# Patient Record
Sex: Male | Born: 1990 | Race: Black or African American | Hispanic: No | Marital: Single | Smoking: Current every day smoker
Health system: Southern US, Community
[De-identification: ages and names within clinical notes are randomized; demographics above are authoritative.]

## PROBLEM LIST (undated history)

## (undated) ENCOUNTER — Emergency Department (HOSPITAL_COMMUNITY): Admission: EM | Discharge status: Against Medical Advice | Payer: Self-pay

---
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
## 2008-02-13 ENCOUNTER — Emergency Department (HOSPITAL_COMMUNITY): Admission: EM | Admit: 2008-02-13 | Discharge: 2008-02-13 | Payer: Self-pay | Admitting: Emergency Medicine

## 2008-02-20 ENCOUNTER — Emergency Department (HOSPITAL_COMMUNITY): Admission: EM | Admit: 2008-02-20 | Discharge: 2008-02-20 | Payer: Self-pay | Admitting: Emergency Medicine

## 2008-02-26 ENCOUNTER — Emergency Department (HOSPITAL_COMMUNITY): Admission: EM | Admit: 2008-02-26 | Discharge: 2008-02-26 | Payer: Self-pay | Admitting: Emergency Medicine

## 2010-05-28 ENCOUNTER — Emergency Department (HOSPITAL_COMMUNITY): Admission: EM | Admit: 2010-05-28 | Discharge: 2010-05-28 | Payer: Self-pay | Admitting: Family Medicine

## 2010-09-25 LAB — GC/CHLAMYDIA PROBE AMP, GENITAL
Chlamydia, DNA Probe: NEGATIVE
GC Probe Amp, Genital: NEGATIVE

## 2011-12-20 ENCOUNTER — Emergency Department (HOSPITAL_COMMUNITY)
Admission: EM | Admit: 2011-12-20 | Discharge: 2011-12-20 | Disposition: A | Discharge status: Home / Self Care | Payer: Self-pay | Attending: Emergency Medicine | Admitting: Emergency Medicine

## 2011-12-20 ENCOUNTER — Encounter (HOSPITAL_COMMUNITY): Payer: Self-pay

## 2011-12-20 DIAGNOSIS — L02419 Cutaneous abscess of limb, unspecified: Secondary | ICD-10-CM | POA: Insufficient documentation

## 2011-12-20 DIAGNOSIS — L0390 Cellulitis, unspecified: Secondary | ICD-10-CM

## 2011-12-20 DIAGNOSIS — F17200 Nicotine dependence, unspecified, uncomplicated: Secondary | ICD-10-CM | POA: Insufficient documentation

## 2011-12-20 MED ORDER — NAPROXEN 500 MG PO TABS
500.0000 mg | ORAL_TABLET | Freq: Once | ORAL | Status: AC
  Administered 2011-12-20: 500 mg via ORAL
  Filled 2011-12-20 (×2): qty 1

## 2011-12-20 MED ORDER — NAPROXEN 500 MG PO TABS: 500.0000 mg | ORAL_TABLET | Freq: Two times a day (BID) | ORAL | Status: AC

## 2011-12-20 MED ORDER — DOXYCYCLINE HYCLATE 100 MG PO TABS: 100.0000 mg | ORAL_TABLET | Freq: Two times a day (BID) | ORAL | Status: AC

## 2011-12-20 MED ORDER — DOXYCYCLINE HYCLATE 100 MG PO TABS
100.0000 mg | ORAL_TABLET | Freq: Once | ORAL | Status: AC
  Administered 2011-12-20: 100 mg via ORAL
  Filled 2011-12-20: qty 1

## 2011-12-20 MED ORDER — HYDROCODONE-ACETAMINOPHEN 5-325 MG PO TABS: 1.0000 | ORAL_TABLET | Freq: Four times a day (QID) | ORAL | Status: AC | PRN

## 2011-12-20 NOTE — Discharge Instructions (Signed)
Cellulitis Cellulitis is an infection of the tissue under the skin. The infected area is usually red and tender. This is caused by germs. These germs enter the body through cuts or sores. This usually happens in the arms or lower legs. HOME CARE   Take your medicine as told. Finish it even if you start to feel better.   If the infection is on the arm or leg, keep it raised (elevated).   Use a warm cloth on the infected area several times a day.   See your doctor for a follow-up visit as told.  GET HELP RIGHT AWAY IF:   You are tired or confused.   You throw up (vomit).   You have watery poop (diarrhea).   You feel ill and have muscle aches.   You have a fever.  MAKE SURE YOU:   Understand these instructions.   Will watch your condition.   Will get help right away if you are not doing well or get worse.  Document Released: 12/18/2007 Document Revised: 06/20/2011 Document Reviewed: 06/02/2009 [employer] Patient Information 2012 [employer]. 

## 2011-12-20 NOTE — ED Provider Notes (Signed)
History   This chart was scribed for Gerald Kras, MD by Shari Heritage. The patient was seen in room STRE5/STRE5. Patient's care was started at 1156.     CSN: [id number]  Arrival date & time 12/20/11  1156   None    Chief complaint knee pain and swelling   The history is provided by the patient. No language interpreter was used.   Gerald Barber is a 21 y.o. male who presents to the Emergency Department complaining of swelling and drainage at a blister in right knee onset 3 days ago. Patient says that he popped the blister and began experiencing pain in the area earlier this week. Patient reports that the blister appeared initially about 5 weeks ago. Patient says that he feels the most pain and discomfort when he walks or extends his knee, but at rest he doesn't experience much discomfort. Patient denies any other pain, trauma or discomfort. Patient denies experiencing these symptoms before. Patient is a current everyday smoker.  History reviewed. No pertinent past medical history.  History reviewed. No pertinent past surgical history.  No family history on file.  History  Substance Use Topics  . Smoking status: Current Everyday Smoker  . Smokeless tobacco: Not on file  . Alcohol Use: No      Review of Systems A complete 10 system review of systems was obtained and all systems are negative except as noted in the HPI and PMH.   Allergies  Review of patient's allergies indicates no known allergies.  Home Medications  No current outpatient prescriptions on file.  BP 134/83  Pulse 65  Temp(Src) 98.2 F (36.8 C) (Oral)  Resp 16  SpO2 99%  Physical Exam  Nursing note and vitals reviewed. Constitutional: He appears well-developed and well-nourished. No distress.  HENT:  Head: Normocephalic and atraumatic.  Right Ear: External ear normal.  Left Ear: External ear normal.  Eyes: Conjunctivae are normal. Right eye exhibits no discharge. Left eye exhibits no discharge. No scleral  icterus.  Neck: Neck supple. No tracheal deviation present.  Cardiovascular: Normal rate.   Pulmonary/Chest: Effort normal. No stridor. No respiratory distress.  Musculoskeletal: Normal range of motion. He exhibits no edema.  Neurological: He is alert. Cranial nerve deficit: no gross deficits.  Skin: Skin is warm and dry. No rash noted. There is erythema (medial aspect of knee. no joint diffusion. small papule without fluctuance or induration).  Psychiatric: He has a normal mood and affect.    ED Course  Procedures (including critical care time) DIAGNOSTIC STUDIES: Oxygen Saturation is 99% on room air, normalby my interpretation.    COORDINATION OF CARE: 12:24PM - Patient informed of current plan for treatment and evaluation and agrees with plan at this time. Will prescribe pain medications and antibiotics. Suggested that patient use warm soaps and compresses to increase blood supply and reduce drainage in knee.   Labs Reviewed - No data to display No results found.   1. Cellulitis       MDM  Patient's exam is consistent with a cellulitis. There does not appear to be evidence of an abscess or signs of a septic arthritis. The patient will be prescribed antibiotics and I will have him followup in 2 days to be rechecked   I personally performed the services described in this documentation, which was scribed in my presence.  The recorded information has been reviewed and considered.   Gerald Kras, MD 12/20/11 [phone]

## 2011-12-20 NOTE — ED Notes (Signed)
Rt. Knee swollen, red and inflammed, appears to be infected

## 2014-03-21 ENCOUNTER — Encounter (HOSPITAL_COMMUNITY): Payer: Self-pay | Admitting: Emergency Medicine

## 2014-03-21 ENCOUNTER — Emergency Department (HOSPITAL_COMMUNITY): Payer: Self-pay

## 2014-03-21 ENCOUNTER — Emergency Department (HOSPITAL_COMMUNITY)
Admission: EM | Admit: 2014-03-21 | Discharge: 2014-03-21 | Disposition: A | Discharge status: Home / Self Care | Payer: Self-pay | Attending: Emergency Medicine | Admitting: Emergency Medicine

## 2014-03-21 DIAGNOSIS — Y990 Civilian activity done for income or pay: Secondary | ICD-10-CM | POA: Insufficient documentation

## 2014-03-21 DIAGNOSIS — F17200 Nicotine dependence, unspecified, uncomplicated: Secondary | ICD-10-CM | POA: Insufficient documentation

## 2014-03-21 DIAGNOSIS — Y9389 Activity, other specified: Secondary | ICD-10-CM | POA: Insufficient documentation

## 2014-03-21 DIAGNOSIS — W230XXA Caught, crushed, jammed, or pinched between moving objects, initial encounter: Secondary | ICD-10-CM | POA: Insufficient documentation

## 2014-03-21 DIAGNOSIS — S6000XA Contusion of unspecified finger without damage to nail, initial encounter: Secondary | ICD-10-CM | POA: Insufficient documentation

## 2014-03-21 DIAGNOSIS — S67196A Crushing injury of right little finger, initial encounter: Secondary | ICD-10-CM

## 2014-03-21 DIAGNOSIS — Y9289 Other specified places as the place of occurrence of the external cause: Secondary | ICD-10-CM | POA: Insufficient documentation

## 2014-03-21 DIAGNOSIS — S6980XA Other specified injuries of unspecified wrist, hand and finger(s), initial encounter: Secondary | ICD-10-CM | POA: Insufficient documentation

## 2014-03-21 DIAGNOSIS — S6990XA Unspecified injury of unspecified wrist, hand and finger(s), initial encounter: Secondary | ICD-10-CM | POA: Insufficient documentation

## 2014-03-21 DIAGNOSIS — T148XXA Other injury of unspecified body region, initial encounter: Secondary | ICD-10-CM

## 2014-03-21 MED ORDER — HYDROCODONE-ACETAMINOPHEN 5-325 MG PO TABS
1.0000 | ORAL_TABLET | Freq: Four times a day (QID) | ORAL | Status: DC | PRN
Start: 2014-03-21 — End: 2020-12-06

## 2014-03-21 MED ORDER — NAPROXEN 500 MG PO TABS: 500.0000 mg | ORAL_TABLET | Freq: Two times a day (BID) | ORAL | Status: DC | PRN

## 2014-03-21 NOTE — ED Notes (Signed)
Per pt injured right pinky finger at work a week ago. Pt finger swollen and fingernail darkened.

## 2014-03-21 NOTE — ED Provider Notes (Signed)
CSN: [id number]     Arrival date & time 03/21/14  1549 History   None    This chart was scribed for non-physician practitioner, Allen Derry PA-C working with No att. providers found by Arlan Organ, ED Scribe. This patient was seen in room TR04C/TR04C and the patient's care was started at 5:38 PM.   Chief Complaint  Patient presents with  . Finger Injury   Patient is a 23 y.o. male presenting with hand injury. The history is provided by the patient. No language interpreter was used.  Hand Injury Location:  Finger Time since incident:  2 days Injury: yes   Finger location:  R little finger Pain details:    Radiates to:  Does not radiate   Severity:  Moderate   Onset quality:  Sudden   Timing:  Constant   Progression:  Unchanged Chronicity:  New Dislocation: no   Foreign body present:  No foreign bodies Prior injury to area:  No Relieved by:  None tried Worsened by:  Nothing tried Ineffective treatments:  None tried Associated symptoms: swelling   Associated symptoms: no fever, no numbness and no tingling     HPI Comments: Gerald Barber is a 23 y.o. male who presents to the Emergency Department complaining of a finger injury sustained 2 days ago. Pt states he was at work when his finger was crushed between a wall and a cement brick. Now c/o of constant pain with associated swelling to the R pinky finger that is unchanged. Pt describes pain as throbbing and nonradiating. He has not tried any OTC medications or any home remedies to help manage symptoms. No fever or chills, no warmth to the area. No weakness, loss of sensation, or paresthesia. Does not bleed easily. No known allergies to medications. He has no pertinent past medical history. No other concerns this visit.    History reviewed. No pertinent past medical history. History reviewed. No pertinent past surgical history. History reviewed. No pertinent family history. History  Substance Use Topics  . Smoking status:  Current Every Day Smoker  . Smokeless tobacco: Not on file  . Alcohol Use: No    Review of Systems  Constitutional: Negative for fever and chills.  Musculoskeletal: Positive for arthralgias and joint swelling.  Skin: Positive for color change. Negative for wound.  Neurological: Negative for weakness and numbness.  Hematological: Does not bruise/bleed easily.  A complete 10 system review of systems was obtained and all systems are negative except as noted in the HPI and PMH.    Allergies  Review of patient's allergies indicates no known allergies.  Home Medications   Prior to Admission medications   Not on File   Triage Vitals: BP 138/74  Pulse 79  Temp(Src) 98.9 F (37.2 C)  Resp 18  Wt 139 lb 4 oz (63.163 kg)  SpO2 99%   Physical Exam  Nursing note and vitals reviewed. Constitutional: He is oriented to person, place, and time. Vital signs are normal. He appears well-developed and well-nourished. No distress.  HENT:  Head: Normocephalic and atraumatic.  Mouth/Throat: Mucous membranes are normal.  Eyes: Conjunctivae and EOM are normal.  Neck: Normal range of motion. Neck supple.  Cardiovascular: Normal rate and intact distal pulses.   Intact distal pulses, cap refill present in all digits  Pulmonary/Chest: Effort normal. No respiratory distress.  Abdominal: Normal appearance. He exhibits no distension.  Musculoskeletal: Normal range of motion.       Right hand: He exhibits tenderness and swelling.  He exhibits normal range of motion, no bony tenderness, normal two-point discrimination, normal capillary refill, no deformity and no laceration. Normal sensation noted. Normal strength noted.       Hands: R pinky with subungal hemtoma and associated swelling, mildly TTP, with FROM in DIP/PIP/MCP joints. Distal pulses intact. Sensation grossly intact, 2 point discrimination intact, no bony deformity or TTP. Strength 5/5 in all extremities  Neurological: He is alert and oriented  to person, place, and time. He has normal strength. No sensory deficit.  Skin: Skin is warm, dry and intact. Bruising noted.  subungal hematoma  Psychiatric: He has a normal mood and affect.    ED Course  NERVE BLOCK Date/Time: 03/21/2014 7:45 PM Performed by: CAMPRUBI-SOMS, Yoshiaki Kreuser STRUPP Authorized by: Ramond Marrow Consent: Verbal consent obtained. Risks and benefits: risks, benefits and alternatives were discussed Consent given by: patient Patient understanding: patient states understanding of the procedure being performed Patient consent: the patient's understanding of the procedure matches consent given Patient identity confirmed: verbally with patient Indications: pain relief Indications comments: and subungal hematoma drainage Body area: upper extremity Nerve: digital Laterality: left Patient sedated: no Preparation: Patient was prepped and draped in the usual sterile fashion. Patient position: sitting Needle gauge: 22 G Location technique: anatomical landmarks Local anesthetic: lidocaine 2% without epinephrine Anesthetic total: 4 ml Outcome: pain improved Patient tolerance: Patient tolerated the procedure well with no immediate complications.   (including critical care time)  DIAGNOSTIC STUDIES: Oxygen Saturation is 99% on RA, Normal by my interpretation.    COORDINATION OF CARE: 5:37 PM- Will order DG finger little R. Will relieve pressure underneath R pinky finger nail. Advised pt to keep area clean and to wash hands thoroughly throughout the day. Made pt aware of signs of infections and to return if any noted symptoms. Discussed treatment plan with pt at bedside and pt agreed to plan.     Labs Review Labs Reviewed - No data to display  Imaging Review Dg Finger Little Right  03/21/2014   CLINICAL DATA:  Crush injury to distal tip of right middle finger.  EXAM: RIGHT LITTLE FINGER 2+V  COMPARISON:  None.  FINDINGS: No acute osseous or joint  abnormality.  Mild soft tissue swelling.  IMPRESSION: No acute osseous or joint abnormality.   Electronically Signed   By: Leanna Battles M.D.   On: 03/21/2014 16:39     EKG Interpretation None      MDM   Final diagnoses:  Crushing injury of right little finger, initial encounter  Hematoma    23y/o male with a subungual hematoma of his right pinky finger. X-ray obtained and was negative. Digit neurovascularly intact. Patient opted for a drainage of the subungual hematoma which was performed with a cautery. Patient tolerated this procedure well and was relieved of pressure under the nail. Discussed the risk of infection, and discussed that in order to avoid infection he would need to use antibiotic ointment and keep his wound clean. Discussed ice and use of pain medication given today. Discussed followup in 7 days.   I personally performed the services described in this documentation, which was scribed in my presence. The recorded information has been reviewed and is accurate.   BP 109/71  Pulse 51  Temp(Src) 98.6 F (37 C) (Oral)  Resp 16  Wt 139 lb 4 oz (63.163 kg)  SpO2 100%  Meds ordered this encounter  Medications  . HYDROcodone-acetaminophen (NORCO) 5-325 MG per tablet    Sig: Take 1-2 tablets  by mouth every 6 (six) hours as needed for severe pain.    Dispense:  10 tablet    Refill:  0    Order Specific Question:  Supervising Provider    Answer:  Eber Hong D [[id number]]  . naproxen (NAPROSYN) 500 MG tablet    Sig: Take 1 tablet (500 mg total) by mouth 2 (two) times daily as needed for mild pain, moderate pain or headache (TAKE WITH MEALS.).    Dispense:  20 tablet    Refill:  0    Order Specific Question:  Supervising Provider    Answer:  Eber Hong D [[id number]]     [place] Camprubi-Soms, PA-C 03/21/14 2002

## 2014-03-21 NOTE — Discharge Instructions (Signed)
Use the finger splint for comfort. Keep wound and clean with mild soap and water. Keep area covered with a topical antibiotic ointment and bandage, keep bandage dry. Ice and elevate for additional pain relief and swelling. Alternate between naprosyn and norco for additional pain relief. Follow up with your primary care doctor or the [facility] in approximately 7 days for wound recheck. Monitor area for signs of infection to include, but not limited to: increasing pain, redness, drainage/pus, or swelling. Return to emergency department for emergent changing or worsening symptoms. Use the resource guide below to find a doctor if you don't have one.   Crush Injury, Fingers or Toes A crush injury to the fingers or toes means the tissues have been damaged by being squeezed (compressed). There will be bleeding into the tissues and swelling. Often, blood will collect under the skin. When this happens, the skin on the finger often dies and may slough off (shed) 1 week to 10 days later. Usually, new skin is growing underneath. If the injury has been too severe and the tissue does not survive, the damaged tissue may begin to turn black over several days.  Wounds which occur because of the crushing may be stitched (sutured) shut. However, crush injuries are more likely to become infected than other injuries.These wounds may not be closed as tightly as other types of cuts to prevent infection. Nails involved are often lost. These usually grow back over several weeks.  DIAGNOSIS X-rays may be taken to see if there is any injury to the bones. TREATMENT Broken bones (fractures) may be treated with splinting, depending on the fracture. Often, no treatment is required for fractures of the last bone in the fingers or toes. HOME CARE INSTRUCTIONS   The crushed part should be raised (elevated) above the heart or center of the chest as much as possible for the first several days or as directed. This helps  with pain and lessens swelling. Less swelling increases the chances that the crushed part will survive.  Put ice on the injured area.  Put ice in a plastic bag.  Place a towel between your skin and the bag.  Leave the ice on for 15-20 minutes, 03-04 times a day for the first 2 days.  Only take over-the-counter or prescription medicines for pain, discomfort, or fever as directed by your caregiver.  Use your injured part only as directed.  Change your bandages (dressings) as directed.  Keep all follow-up appointments as directed by your caregiver. Not keeping your appointment could result in a chronic or permanent injury, pain, and disability. If there is any problem keeping the appointment, you must call to reschedule. SEEK IMMEDIATE MEDICAL CARE IF:   There is redness, swelling, or increasing pain in the wound area.  Pus is coming from the wound.  You have a fever.  You notice a bad smell coming from the wound or dressing.  The edges of the wound do not stay together after the sutures have been removed.  You are unable to move the injured finger or toe. MAKE SURE YOU:   Understand these instructions.  Will watch your condition.  Will get help right away if you are not doing well or get worse. Document Released: 07/01/2005 Document Revised: 09/23/2011 Document Reviewed: 11/16/2010 [facility] Patient Information 2015 [place], [place]. This information is not intended to replace advice given to you by your health care provider. Make sure you discuss any questions you have with your health care  provider.  Cryotherapy Cryotherapy is when you put ice on your injury. Ice helps lessen pain and puffiness (swelling) after an injury. Ice works the best when you start using it in the first 24 to 48 hours after an injury. HOME CARE  Put a dry or damp towel between the ice pack and your skin.  You may press gently on the ice pack.  Leave the ice on for no more than 10 to 20 minutes at  a time.  Check your skin after 5 minutes to make sure your skin is okay.  Rest at least 20 minutes between ice pack uses.  Stop using ice when your skin loses feeling (numbness).  Do not use ice on someone who cannot tell you when it hurts. This includes small children and people with memory problems (dementia). GET HELP RIGHT AWAY IF:  You have white spots on your skin.  Your skin turns blue or pale.  Your skin feels waxy or hard.  Your puffiness gets worse. MAKE SURE YOU:   Understand these instructions.  Will watch your condition.  Will get help right away if you are not doing well or get worse. Document Released: 12/18/2007 Document Revised: 09/23/2011 Document Reviewed: 02/21/2011 [facility] Patient Information 2015 [place], [place]. This information is not intended to replace advice given to you by your health care provider. Make sure you discuss any questions you have with your health care provider.  Hematoma A hematoma is a collection of blood under the skin, in an organ, in a body space, in a joint space, or in other tissue. The blood can clot to form a lump that you can see and feel. The lump is often firm and may sometimes become sore and tender. Most hematomas get better in a few days to weeks. However, some hematomas may be serious and require medical care. Hematomas can range in size from very small to very large. CAUSES  A hematoma can be caused by a blunt or penetrating injury. It can also be caused by spontaneous leakage from a blood vessel under the skin. Spontaneous leakage from a blood vessel is more likely to occur in older people, especially those taking blood thinners. Sometimes, a hematoma can develop after certain medical procedures. SIGNS AND SYMPTOMS   A firm lump on the body.  Possible pain and tenderness in the area.  Bruising.Blue, dark blue, purple-red, or yellowish skin may appear at the site of the hematoma if the hematoma is close to the surface  of the skin. For hematomas in deeper tissues or body spaces, the signs and symptoms may be subtle. For example, an intra-abdominal hematoma may cause abdominal pain, weakness, fainting, and shortness of breath. An intracranial hematoma may cause a headache or symptoms such as weakness, trouble speaking, or a change in consciousness. DIAGNOSIS  A hematoma can usually be diagnosed based on your medical history and a physical exam. Imaging tests may be needed if your health care provider suspects a hematoma in deeper tissues or body spaces, such as the abdomen, head, or chest. These tests may include ultrasonography or a CT scan.  TREATMENT  Hematomas usually go away on their own over time. Rarely does the blood need to be drained out of the body. Large hematomas or those that may affect vital organs will sometimes need surgical drainage or monitoring. HOME CARE INSTRUCTIONS   Apply ice to the injured area:   Put ice in a plastic bag.   Place a towel between your skin  and the bag.   Leave the ice on for 20 minutes, 2-3 times a day for the first 1 to 2 days.   After the first 2 days, switch to using warm compresses on the hematoma.   Elevate the injured area to help decrease pain and swelling. Wrapping the area with an elastic bandage may also be helpful. Compression helps to reduce swelling and promotes shrinking of the hematoma. Make sure the bandage is not wrapped too tight.   If your hematoma is on a lower extremity and is painful, crutches may be helpful for a couple days.   Only take over-the-counter or prescription medicines as directed by your health care provider. SEEK IMMEDIATE MEDICAL CARE IF:   You have increasing pain, or your pain is not controlled with medicine.   You have a fever.   You have worsening swelling or discoloration.   Your skin over the hematoma breaks or starts bleeding.   Your hematoma is in your chest or abdomen and you have weakness, shortness  of breath, or a change in consciousness.  Your hematoma is on your scalp (caused by a fall or injury) and you have a worsening headache or a change in alertness or consciousness. MAKE SURE YOU:   Understand these instructions.  Will watch your condition.  Will get help right away if you are not doing well or get worse. Document Released: 02/13/2004 Document Revised: 03/03/2013 Document Reviewed: 12/09/2012 [facility] Patient Information 2015 [place], [place]. This information is not intended to replace advice given to you by your health care provider. Make sure you discuss any questions you have with your health care provider.  Emergency Department Resource Guide 1) Find a Doctor and Pay Out of Pocket Although you won't have to find out who is covered by your insurance plan, it is a good idea to ask around and get recommendations. You will then need to call the office and see if the doctor you have chosen will accept you as a new patient and what types of options they offer for patients who are self-pay. Some doctors offer discounts or will set up payment plans for their patients who do not have insurance, but you will need to ask so you aren't surprised when you get to your appointment.  2) Contact Your Local Health Department Not all health departments have doctors that can see patients for sick visits, but many do, so it is worth a call to see if yours does. If you don't know where your local health department is, you can check in your phone book. The CDC also has a tool to help you locate your state's health department, and many state websites also have listings of all of their local health departments.  3) Find a Walk-in Clinic If your illness is not likely to be very severe or complicated, you may want to try a walk in clinic. These are popping up all over the country in pharmacies, drugstores, and shopping centers. They're usually staffed by nurse practitioners or physician assistants that  have been trained to treat common illnesses and complaints. They're usually fairly quick and inexpensive. However, if you have serious medical issues or chronic medical problems, these are probably not your best option.  No Primary Care Doctor: - Call [facility] at  [phone] - they can help you locate a primary care doctor that  accepts your insurance, provides certain services, etc. - Physician Referral Service- [phone]  Chronic Pain Problems: Organization  Address  Phone   Notes  [facility]  [phone] Patients need to be referred by their primary care doctor.   Medication Assistance: Organization         Address  Phone   Notes  [facility] Medication [facility] [address] [place], [place] [address] [phone] --Must be a resident of [facility] -- Must have NO insurance coverage whatsoever (no Medicaid/ Medicare, etc.) -- The pt. MUST have a primary care doctor that directs their care regularly and follows them in the community   [facility]  [phone]   [employer]  [phone]    Agencies that provide inexpensive medical care: Organization         Address  Phone   Notes  [facility]  [phone]   [facility]    [phone]   [facility] [address] [place], [place] [address] [phone]   [facility] [address] [place] [phone]   [facility]    [phone]   [facility]    [phone]   [facility]  [address], [place] Phone:  [phone], Fax:  [phone] Hours of Operation:  9 am - 6 pm, M-F.  Also accepts Medicaid/Medicare and self-pay.  [facility]  [address], [place] Phone: [phone], Fax: [phone]. Hours of Operation:  8:30 am - 5:30 pm, M-F.  Also accepts Medicaid and  self-pay.  [facility] [place] [address], [place] Phone: [phone]   [facility] [address] Natasha Bence [place], [place] [phone], Ext. [phone] Mondays & Thursdays: 7-9 AM.  First 15 patients are seen on a first come, first serve basis.    Medicaid-accepting [facility] Providers:  Organization         Address  Phone   Notes  [facility] [address], [place] [phone] Also accepts self-pay patients.  [facility] [address] Laure[address] Josephs [place], [place]  [phone]   [facility] [address], [place] [phone]   [facility] [address]iketh [place], [place] [phone]   Renaye Rakers [address], [place]   [phone] Only accepts [place] patients after they have their name applied to their card.   Self-Pay (no insurance) in [facility]:  Organization         Address  Phone   Notes  Sickle Cell Patients, [facility] [address] [place], [place] [phone]   [facility] [address] [place], [place] [phone]   [facility] [place]  [address], [place] [phone]   [facility]/Dr. Osei-Bonsu  [address] [place] or [address], [place] [phone] Phone number for both [place] and [place] locations is the same.  [facility] [address] [place] [phone]   [facility] [address] [place] or [address] [phone]   [facility] [address], [place] [phone], phone; [phone], fax Sees patients 1st and 3rd Saturday of every month.  Must not qualify for  public or private insurance (i.e. Medicaid, Medicare, [place] Health Choice, Veterans' Benefits)  Household income  should be no more than 200% of the poverty level The clinic cannot treat you if you are pregnant or think you are pregnant  Sexually transmitted diseases are not treated at the clinic.

## 2014-03-22 NOTE — ED Provider Notes (Signed)
Medical screening examination/treatment/procedure(s) were performed by non-physician practitioner and as supervising physician I was immediately available for consultation/collaboration.   EKG Interpretation None        Aerie Donica J. Amyra Vantuyl, MD 03/22/14 0043 

## 2015-03-23 ENCOUNTER — Emergency Department (HOSPITAL_COMMUNITY)
Admission: EM | Admit: 2015-03-23 | Discharge: 2015-03-23 | Disposition: A | Discharge status: Home / Self Care | Payer: Self-pay | Attending: Emergency Medicine | Admitting: Emergency Medicine

## 2015-03-23 ENCOUNTER — Encounter (HOSPITAL_COMMUNITY): Payer: Self-pay | Admitting: Emergency Medicine

## 2015-03-23 DIAGNOSIS — L0231 Cutaneous abscess of buttock: Secondary | ICD-10-CM | POA: Insufficient documentation

## 2015-03-23 DIAGNOSIS — Z720 Tobacco use: Secondary | ICD-10-CM | POA: Insufficient documentation

## 2015-03-23 MED ORDER — LIDOCAINE HCL (PF) 1 % IJ SOLN
5.0000 mL | Freq: Once | INTRAMUSCULAR | Status: AC
  Administered 2015-03-23: 5 mL
  Filled 2015-03-23: qty 5

## 2015-03-23 MED ORDER — NAPROXEN 500 MG PO TABS: 500.0000 mg | ORAL_TABLET | Freq: Two times a day (BID) | ORAL | Status: DC

## 2015-03-23 MED ORDER — SULFAMETHOXAZOLE-TRIMETHOPRIM 800-160 MG PO TABS: 1.0000 | ORAL_TABLET | Freq: Two times a day (BID) | ORAL | Status: AC

## 2015-03-23 NOTE — Discharge Instructions (Signed)
Abscess °An abscess (boil or furuncle) is an infected area on or under the skin. This area is filled with yellowish-white fluid (pus) and other material (debris). °HOME CARE  °· Only take medicines as told by your doctor. °· If you were given antibiotic medicine, take it as directed. Finish the medicine even if you start to feel better. °· If gauze is used, follow your doctor's directions for changing the gauze. °· To avoid spreading the infection: °¨ Keep your abscess covered with a bandage. °¨ Wash your hands well. °¨ Do not share personal care items, towels, or whirlpools with others. °¨ Avoid skin contact with others. °· Keep your skin and clothes clean around the abscess. °· Keep all doctor visits as told. °GET HELP RIGHT AWAY IF:  °· You have more pain, puffiness (swelling), or redness in the wound site. °· You have more fluid or blood coming from the wound site. °· You have muscle aches, chills, or you feel sick. °· You have a fever. °MAKE SURE YOU:  °· Understand these instructions. °· Will watch your condition. °· Will get help right away if you are not doing well or get worse. °Document Released: 12/18/2007 Document Revised: 12/31/2011 Document Reviewed: 09/13/2011 °Exit[employer]® Patient Information ©2015 [employer]. This information is not intended to replace advice given to you by your health care provider. Make sure you discuss any questions you have with your health care provider. ° °

## 2015-03-23 NOTE — ED Provider Notes (Signed)
CSN: [id number]     Arrival date & time 03/23/15  1851 History  This chart was scribed for non-physician practitioner, Kerrie Buffalo, NP working with Tilden Fossa, MD, by Jarvis Morgan, ED Scribe. This patient was seen in room TR03C/TR03C and the patient's care was started at 7:22 PM.     Chief Complaint  Patient presents with  . Abscess    The patient said he noticed a bump on the "crack iof his butt".  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     Patient is a 24 y.o. male presenting with abscess. The history is provided by the patient. No language interpreter was used.  Abscess Location:  Ano-genital Ano-genital abscess location:  Gluteal cleft and L buttock Abscess quality: draining, painful and redness   Duration:  2 days Progression:  Unchanged Pain details:    Severity:  Moderate   Duration:  2 days   Timing:  Intermittent   Progression:  Unchanged Chronicity:  New Context: not diabetes and not immunosuppression   Relieved by:  Warm compresses and warm water soaks Exacerbated by: bowel movements. Ineffective treatments:  None tried Associated symptoms: no fever, no nausea and no vomiting   Risk factors: prior abscess     HPI Comments: Faizaan D Shall is a 24 y.o. male with no PMHx who presents to the Emergency Department complaining of moderately painful, firm lesion to the left buttock near intergluteal cleft onset 2 days ago. He endorses some mild drainage from the area and mild pain with bowel movements. Pt reports he applied heat to the area and took a warm bath with mild relief. Pt states he has a h/o abscesses but states he has never had one to the area before. He has not taken any medications PTA. He denies any fever, chills, constipation or diarrhea.   History reviewed. No pertinent past medical history. History reviewed. No pertinent past surgical history. History reviewed. No pertinent family history. Social History  Substance Use  Topics  . Smoking status: Current Every Day Smoker  . Smokeless tobacco: None  . Alcohol Use: No    Review of Systems  Constitutional: Negative for fever.  Gastrointestinal: Negative for nausea, vomiting, diarrhea and constipation.  Skin: Positive for color change (firm, skin lesion to left buttock).  All other systems reviewed and are negative.     Allergies  Review of patient's allergies indicates no known allergies.  Home Medications   Prior to Admission medications   Medication Sig Start Date End Date Taking? Authorizing Provider  HYDROcodone-acetaminophen (NORCO) 5-325 MG per tablet Take 1-2 tablets by mouth every 6 (six) hours as needed for severe pain. 03/21/14   Mercedes Camprubi-Soms, PA-C  naproxen (NAPROSYN) 500 MG tablet Take 1 tablet (500 mg total) by mouth 2 (two) times daily. 03/23/15   Easten Maceachern Orlene Och, NP  sulfamethoxazole-trimethoprim (BACTRIM DS,SEPTRA DS) 800-160 MG per tablet Take 1 tablet by mouth 2 (two) times daily. 03/23/15 03/30/15  Kandice Schmelter Orlene Och, NP   Triage Vitals: BP 125/86 mmHg  Pulse 88  Temp(Src) 98.9 F (37.2 C) (Oral)  Resp 16  SpO2 98%  Physical Exam  Constitutional: He is oriented to person, place, and time. He appears well-developed and well-nourished.  HENT:  Head: Normocephalic and atraumatic.  Eyes: EOM are normal. Pupils are equal, round, and reactive to light.  Neck: Neck supple.  Cardiovascular: Normal rate and regular rhythm.   Pulmonary/Chest: No respiratory distress. He has no  wheezes.  Abdominal: Soft. There is no tenderness.  Genitourinary:  Firm, 3cm tender raised fluctuant area to the left buttock that extends into gluteal fold  Musculoskeletal: Normal range of motion. He exhibits no edema.  Neurological: He is alert and oriented to person, place, and time. No cranial nerve deficit.  Skin: Skin is warm and dry.  Psychiatric: He has a normal mood and affect. His behavior is normal.  Nursing note and vitals reviewed.   ED Course   Procedures (including critical care time)  DIAGNOSTIC STUDIES: Oxygen Saturation is 98% on RA, normal by my interpretation.    COORDINATION OF CARE:  7:59 PM  INCISION AND DRAINAGE PROCEDURE NOTE: Patient identification was confirmed and verbal consent was obtained. This procedure was performed by Kerrie Buffalo, NP at 7:59 PM. Site: left buttock and extends into gluteal fold Sterile procedures observed Needle size: 27 guage Anesthetic used (type and amt): 4 ccs lidocaine 1% w/o epinephrine Blade size: #11 Drainage: moderate amt of purulent drainage Complexity: Complex Packing used: none Site anesthetized, incision made over site, wound drained and explored loculations, rinsed with copious amounts of normal saline, wound not packed , covered with dry, sterile dressing.  Pt tolerated procedure well without complications.  Instructions for care discussed verbally and pt provided with additional written instructions for homecare and f/u.  Chaperoned by Asencion Gowda.   MDM  24 y.o. male with abscess to the left buttock, stable for d/c with antibiotics and pain medication. He will follow up with the general surgeon as needed. Discussed with the patient plan of care and all questioned fully answered. He will return if any problems arise.  Final diagnoses:  Abscess of buttock, left   I personally performed the services described in this documentation, which was scribed in my presence. The recorded information has been reviewed and is accurate.     [address] [place], NP 03/24/15 2255  Tilden Fossa, MD 03/25/15 [phone]

## 2015-03-23 NOTE — ED Notes (Addendum)
The patient said he noticed a bump on the "crack iof his butt".  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

## 2017-01-16 ENCOUNTER — Emergency Department (HOSPITAL_COMMUNITY)
Admission: EM | Admit: 2017-01-16 | Discharge: 2017-01-16 | Disposition: A | Discharge status: Home / Self Care | Payer: Self-pay | Attending: Emergency Medicine | Admitting: Emergency Medicine

## 2017-01-16 ENCOUNTER — Emergency Department (HOSPITAL_COMMUNITY): Payer: Self-pay

## 2017-01-16 DIAGNOSIS — F17200 Nicotine dependence, unspecified, uncomplicated: Secondary | ICD-10-CM | POA: Insufficient documentation

## 2017-01-16 DIAGNOSIS — N5089 Other specified disorders of the male genital organs: Secondary | ICD-10-CM | POA: Insufficient documentation

## 2017-01-16 DIAGNOSIS — R609 Edema, unspecified: Secondary | ICD-10-CM

## 2017-01-16 DIAGNOSIS — S301XXA Contusion of abdominal wall, initial encounter: Secondary | ICD-10-CM | POA: Insufficient documentation

## 2017-01-16 DIAGNOSIS — Y999 Unspecified external cause status: Secondary | ICD-10-CM | POA: Insufficient documentation

## 2017-01-16 DIAGNOSIS — Y939 Activity, unspecified: Secondary | ICD-10-CM | POA: Insufficient documentation

## 2017-01-16 DIAGNOSIS — Y929 Unspecified place or not applicable: Secondary | ICD-10-CM | POA: Insufficient documentation

## 2017-01-16 DIAGNOSIS — X509XXA Other and unspecified overexertion or strenuous movements or postures, initial encounter: Secondary | ICD-10-CM | POA: Insufficient documentation

## 2017-01-16 DIAGNOSIS — Z79899 Other long term (current) drug therapy: Secondary | ICD-10-CM | POA: Insufficient documentation

## 2017-01-16 LAB — URINALYSIS, ROUTINE W REFLEX MICROSCOPIC
Bilirubin Urine: NEGATIVE
Glucose, UA: NEGATIVE mg/dL
Hgb urine dipstick: NEGATIVE
KETONES UR: NEGATIVE mg/dL
LEUKOCYTES UA: NEGATIVE
NITRITE: NEGATIVE
PH: 6 (ref 5.0–8.0)
Protein, ur: NEGATIVE mg/dL
Specific Gravity, Urine: 1.017 (ref 1.005–1.030)

## 2017-01-16 NOTE — ED Provider Notes (Signed)
[facility] Provider Note   CSN: [id number] Arrival date & time: 01/16/17  0630     History   Chief Complaint Chief Complaint  Patient presents with  . Groin Pain    HPI Gerald Barber is a 26 y.o. male.  The history is provided by the patient. No language interpreter was used.  Groin Pain  This is a new problem. Episode onset: 4 days ago. The problem occurs constantly. The problem has been gradually worsening. Nothing aggravates the symptoms. Nothing relieves the symptoms. He has tried nothing for the symptoms. The treatment provided no relief.  Pt complain s  No past medical history on file.  There are no active problems to display for this patient.   No past surgical history on file.     Home Medications    Prior to Admission medications   Medication Sig Start Date End Date Taking? Authorizing Provider  HYDROcodone-acetaminophen (NORCO) 5-325 MG per tablet Take 1-2 tablets by mouth every 6 (six) hours as needed for severe pain. 03/21/14   Street, DiamondMercedes, PA-C  naproxen (NAPROSYN) 500 MG tablet Take 1 tablet (500 mg total) by mouth 2 (two) times daily. 03/23/15   Janne NapoleonNeese, Hope M, NP    Family History No family history on file.  Social History Social History  Substance Use Topics  . Smoking status: Current Every Day Smoker  . Smokeless tobacco: Not on file  . Alcohol use No     Allergies   Patient has no known allergies.   Review of Systems Review of Systems  Genitourinary: Positive for scrotal swelling.  All other systems reviewed and are negative.    Physical Exam Updated Vital Signs BP 136/64 (BP Location: Right Arm)   Pulse 66   Temp 98.2 F (36.8 C) (Oral)   Resp 18   Ht 5\' 8"  (1.727 m)   Wt 62.6 kg (138 lb)   SpO2 99%   BMI 20.98 kg/m   Physical Exam  Constitutional: He appears well-developed and well-nourished.  HENT:  Head: Normocephalic.  Cardiovascular: Normal rate and regular rhythm.   Pulmonary/Chest: Effort normal.    Abdominal: Soft.  Genitourinary: No penile tenderness.  Genitourinary Comments: Tender left base of penis, swollen suprapubic, swollen upper scrotal area,    Musculoskeletal: Normal range of motion.  Neurological: He is alert.  Skin: Skin is warm.  Psychiatric: He has a normal mood and affect.  Nursing note and vitals reviewed.    ED Treatments / Results  Labs (all labs ordered are listed, but only abnormal results are displayed) Labs Reviewed  URINALYSIS, ROUTINE W REFLEX MICROSCOPIC    EKG  EKG Interpretation None       Radiology [place] Scrotum  Result Date: 01/16/2017 CLINICAL DATA:  Pain after intercourse. EXAM: SCROTAL ULTRASOUND DOPPLER ULTRASOUND OF THE TESTICLES TECHNIQUE: Complete ultrasound examination of the testicles, epididymis, and other scrotal structures was performed. Color and spectral Doppler ultrasound were also utilized to evaluate blood flow to the testicles. COMPARISON:  None. FINDINGS: Right testicle Measurements: 4.3 x 2.0 x 3.1 cm. No mass or microlithiasis visualized. Left testicle Measurements: 4.6 x 2.0 x 3.0 cm. No mass or microlithiasis visualized. Right epididymis:  Normal in size and appearance. Left epididymis:  Normal in size and appearance. Hydrocele:  None visualized. Varicocele:  None visualized. Pulsed Doppler interrogation of both testes demonstrates normal low resistance arterial and venous waveforms bilaterally. Note is made of an area of avascular soft tissue fullness at the left penile base. Example  image 54 and subsequent cine series. IMPRESSION: 1. Normal appearance of the testicles and epididymitis. 2. Soft tissue fullness at the left side of the base of the penis. Given lack of vascularity and clinical history, likely indicative of a hematoma. Electronically Signed   By: Jeronimo Greaves M.D.   On: 01/16/2017 08:13   [place] Art/ven Flow Abd Pelv Doppler  Result Date: 01/16/2017 CLINICAL DATA:  Pain after intercourse. EXAM: SCROTAL ULTRASOUND DOPPLER  ULTRASOUND OF THE TESTICLES TECHNIQUE: Complete ultrasound examination of the testicles, epididymis, and other scrotal structures was performed. Color and spectral Doppler ultrasound were also utilized to evaluate blood flow to the testicles. COMPARISON:  None. FINDINGS: Right testicle Measurements: 4.3 x 2.0 x 3.1 cm. No mass or microlithiasis visualized. Left testicle Measurements: 4.6 x 2.0 x 3.0 cm. No mass or microlithiasis visualized. Right epididymis:  Normal in size and appearance. Left epididymis:  Normal in size and appearance. Hydrocele:  None visualized. Varicocele:  None visualized. Pulsed Doppler interrogation of both testes demonstrates normal low resistance arterial and venous waveforms bilaterally. Note is made of an area of avascular soft tissue fullness at the left penile base. Example image 54 and subsequent cine series. IMPRESSION: 1. Normal appearance of the testicles and epididymitis. 2. Soft tissue fullness at the left side of the base of the penis. Given lack of vascularity and clinical history, likely indicative of a hematoma. Electronically Signed   By: Jeronimo Greaves M.D.   On: 01/16/2017 08:13    Procedures Procedures (including critical care time)  Medications Ordered in ED Medications - No data to display   Initial Impression / Assessment and Plan / ED Course  I have reviewed the triage vital signs and the nursing notes.  Pertinent labs & imaging results that were available during my care of the patient were reviewed by me and considered in my medical decision making (see chart for details).     Ultrasound shows a hematoma in the area of pain.  Otherwise normal exam.  Pt counseled on results and treatment.  Final Clinical Impressions(s) / ED Diagnoses   Final diagnoses:  Swelling  Hematoma of groin, initial encounter    New Prescriptions New Prescriptions   No medications on file  An After Visit Summary was printed and given to the patient.   Elson Areas, [place] 01/16/17 [id number]    Gerhard Munch, MD 01/17/17 [phone]

## 2017-01-16 NOTE — Discharge Instructions (Signed)
Return if any problems.   Ice to area,  Ibuprofen for soreness

## 2017-01-16 NOTE — ED Notes (Signed)
Patient transported to Ultrasound 

## 2017-01-16 NOTE — ED Notes (Signed)
Family at bedside. 

## 2017-01-16 NOTE — ED Triage Notes (Signed)
Pt states he was having sex at the beginning of this week and felt a "pop" as has had continuous groin pain radiating to his penis on the left side. Denies any urinary symptoms at this time.

## 2017-05-13 ENCOUNTER — Emergency Department (HOSPITAL_COMMUNITY)
Admission: EM | Admit: 2017-05-13 | Discharge: 2017-05-13 | Disposition: A | Discharge status: Home / Self Care | Payer: No Typology Code available for payment source | Attending: Emergency Medicine | Admitting: Emergency Medicine

## 2017-05-13 ENCOUNTER — Emergency Department (HOSPITAL_COMMUNITY): Payer: No Typology Code available for payment source

## 2017-05-13 ENCOUNTER — Encounter (HOSPITAL_COMMUNITY): Payer: Self-pay

## 2017-05-13 DIAGNOSIS — Y92410 Unspecified street and highway as the place of occurrence of the external cause: Secondary | ICD-10-CM | POA: Insufficient documentation

## 2017-05-13 DIAGNOSIS — M549 Dorsalgia, unspecified: Secondary | ICD-10-CM | POA: Diagnosis present

## 2017-05-13 DIAGNOSIS — Y9389 Activity, other specified: Secondary | ICD-10-CM | POA: Insufficient documentation

## 2017-05-13 DIAGNOSIS — M7918 Myalgia, other site: Secondary | ICD-10-CM | POA: Insufficient documentation

## 2017-05-13 DIAGNOSIS — F17210 Nicotine dependence, cigarettes, uncomplicated: Secondary | ICD-10-CM | POA: Diagnosis not present

## 2017-05-13 DIAGNOSIS — M546 Pain in thoracic spine: Secondary | ICD-10-CM | POA: Insufficient documentation

## 2017-05-13 DIAGNOSIS — Z79899 Other long term (current) drug therapy: Secondary | ICD-10-CM | POA: Insufficient documentation

## 2017-05-13 DIAGNOSIS — Y999 Unspecified external cause status: Secondary | ICD-10-CM | POA: Diagnosis not present

## 2017-05-13 DIAGNOSIS — M542 Cervicalgia: Secondary | ICD-10-CM | POA: Insufficient documentation

## 2017-05-13 DIAGNOSIS — T148XXA Other injury of unspecified body region, initial encounter: Secondary | ICD-10-CM

## 2017-05-13 MED ORDER — METHOCARBAMOL 500 MG PO TABS: 500.0000 mg | ORAL_TABLET | Freq: Two times a day (BID) | ORAL | 0 refills | Status: DC
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
## 2017-05-13 NOTE — ED Notes (Signed)
Patient is A&Ox4.  No signs of distress noted.  Please see providers complete history and physical exam.  
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
## 2017-05-13 NOTE — ED Notes (Signed)
PT states understanding of care given, follow up care, and medication prescribed. PT ambulated from ED to car with a steady gait. 

## 2017-05-13 NOTE — ED Notes (Signed)
Pt ambulated to the room without difficulties.
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
## 2019-05-08 IMAGING — US US ART/VEN ABD/PELV/SCROTUM DOPPLER LTD
1 series · 14 of 25 positions shown · non-contrast
Comparison: None.

CLINICAL DATA: Pain after intercourse.

EXAM:
SCROTAL ULTRASOUND
DOPPLER ULTRASOUND OF THE TESTICLES
TECHNIQUE: Complete ultrasound examination of the testicles, epididymis, and
other scrotal structures was performed. Color and spectral Doppler
ultrasound were also utilized to evaluate blood flow to the
testicles.

[Series 1: us art/ven abd/pelv/scrotum doppler ltd · 0.06mm/px · 14 of 54 slices shown]
[im 1/54]
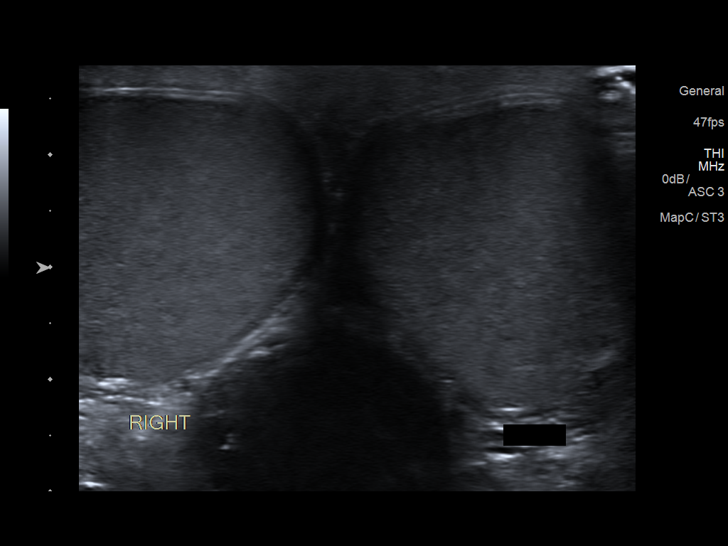
[im 5/54]
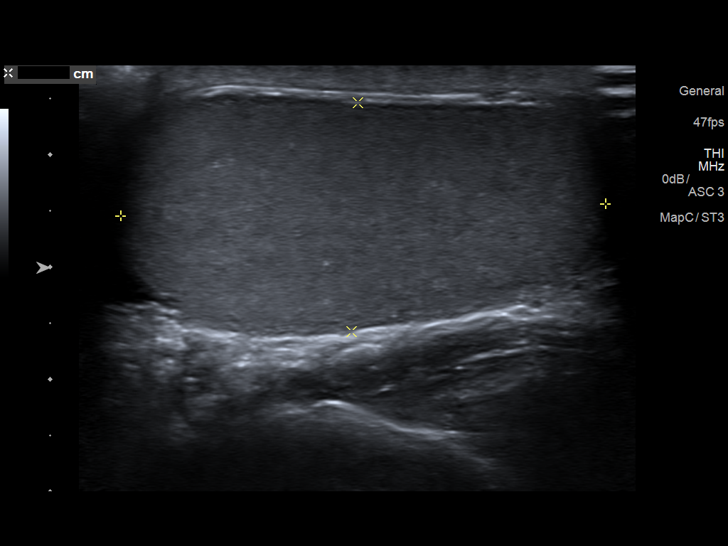
[im 9/54]
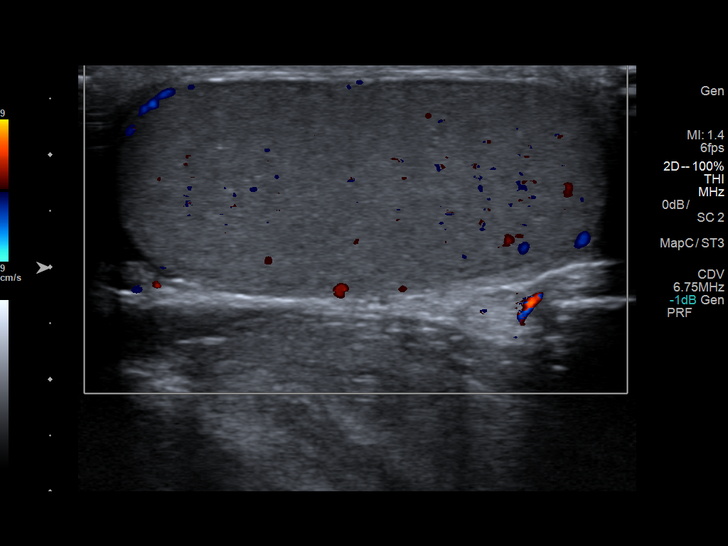
[im 14/54]
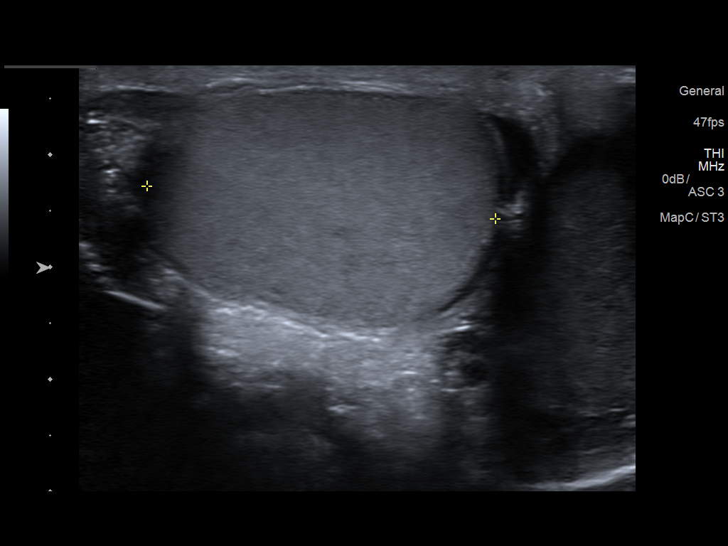
[im 18/54]
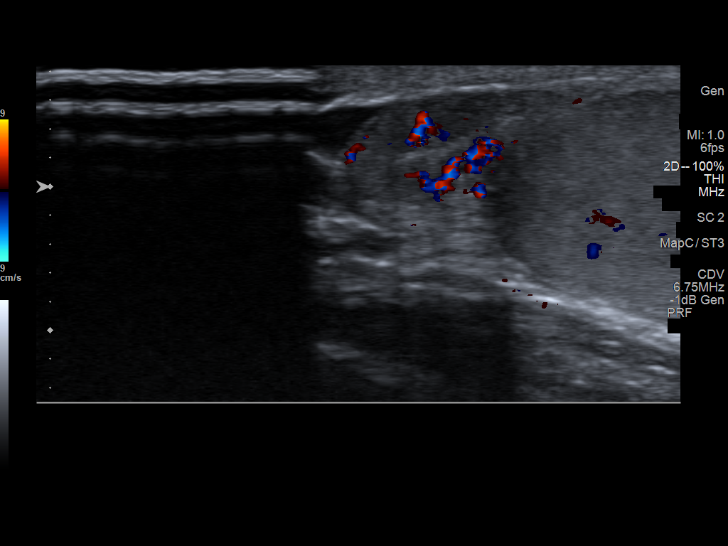
[im 20/54]
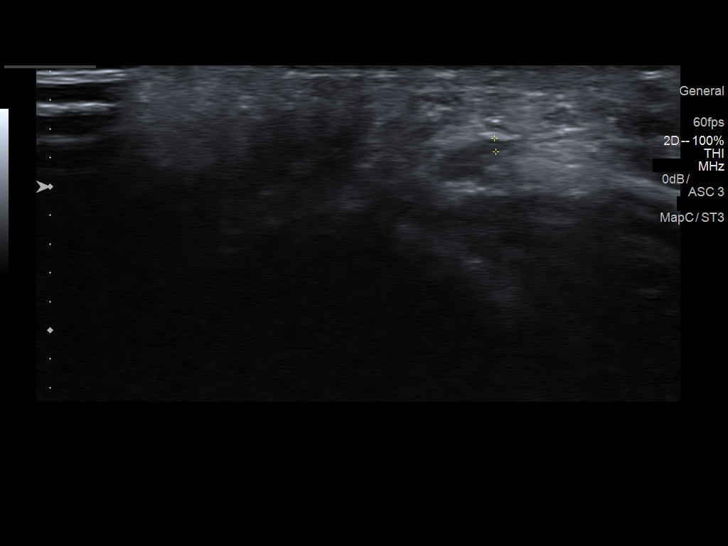
[im 25/54]
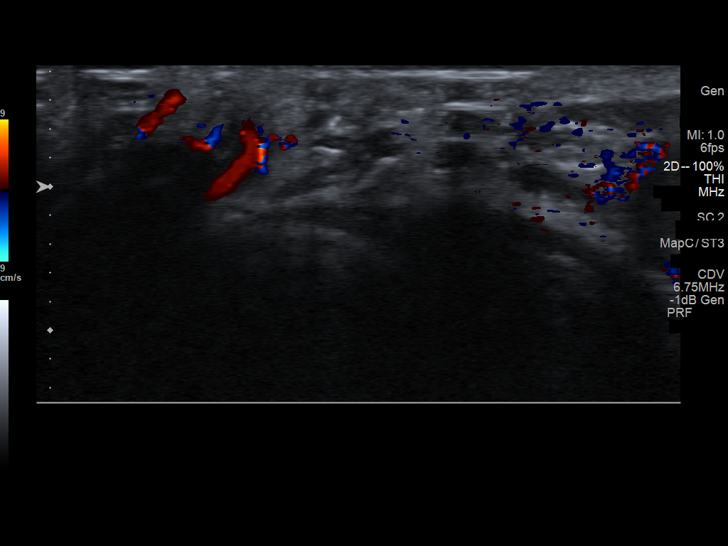
[im 29/54]
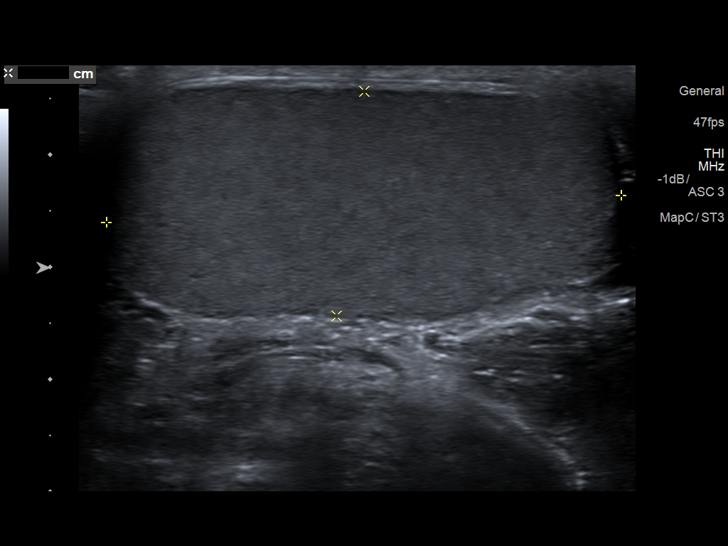
[im 34/54]
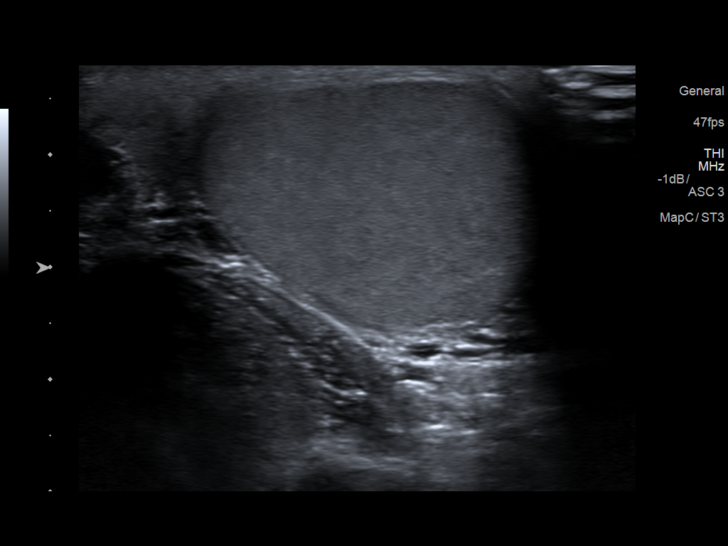
[im 36/54]
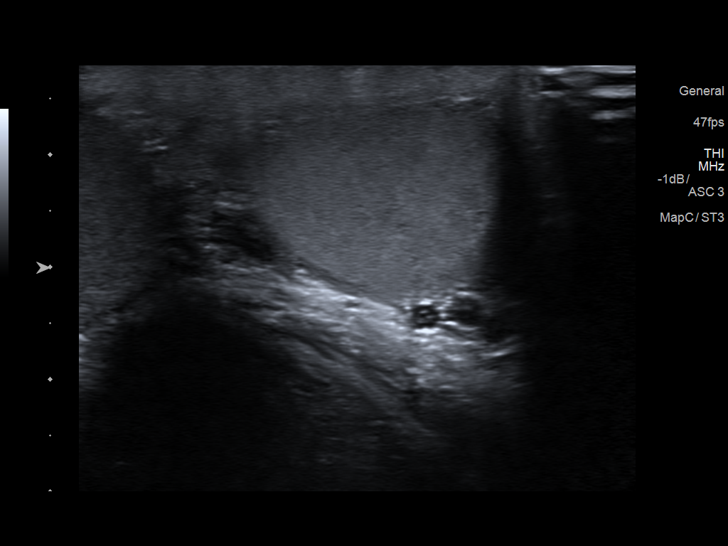
[im 40/54]
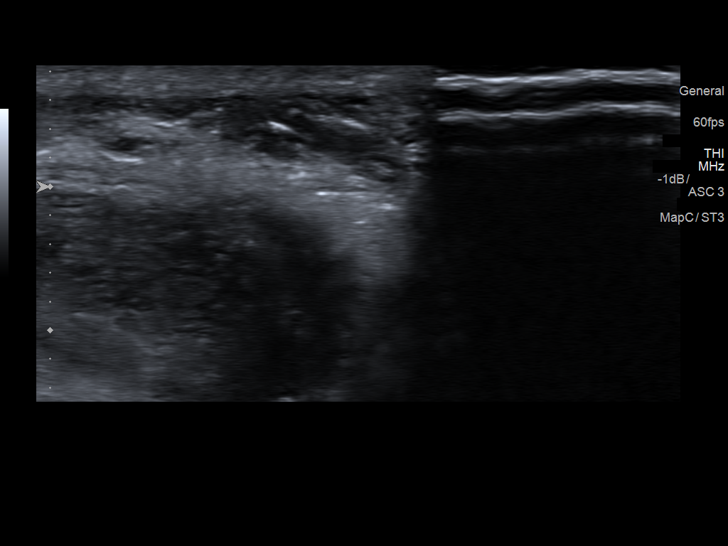
[im 45/54]
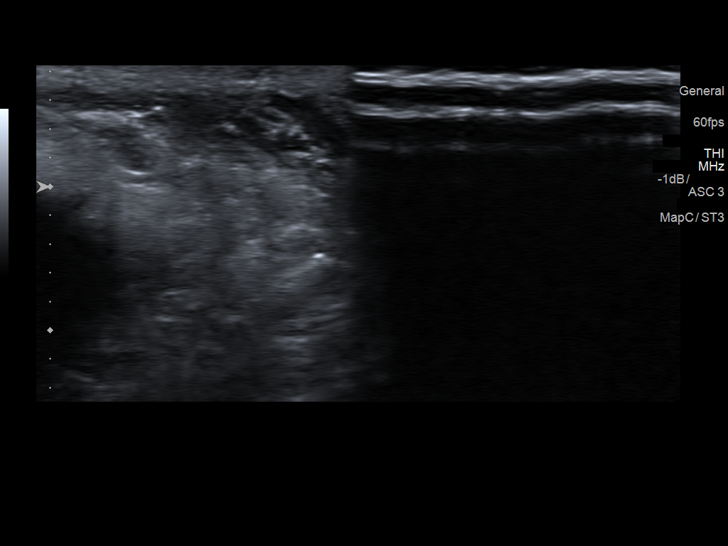
[im 49/54]
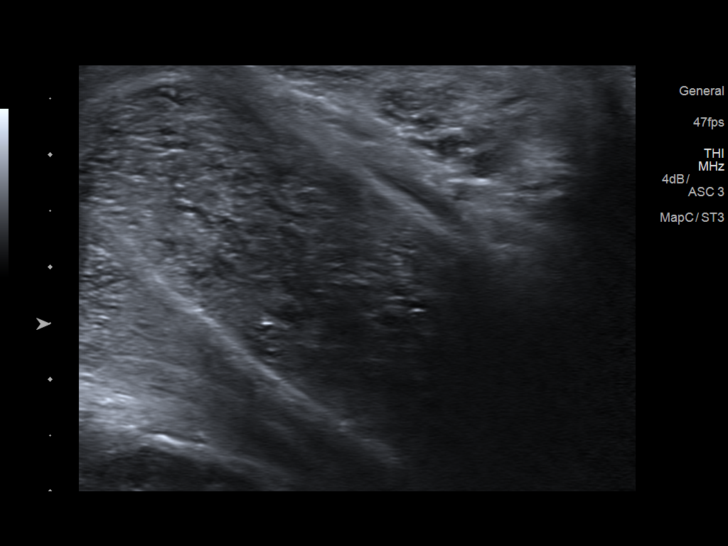
[im 54/54]
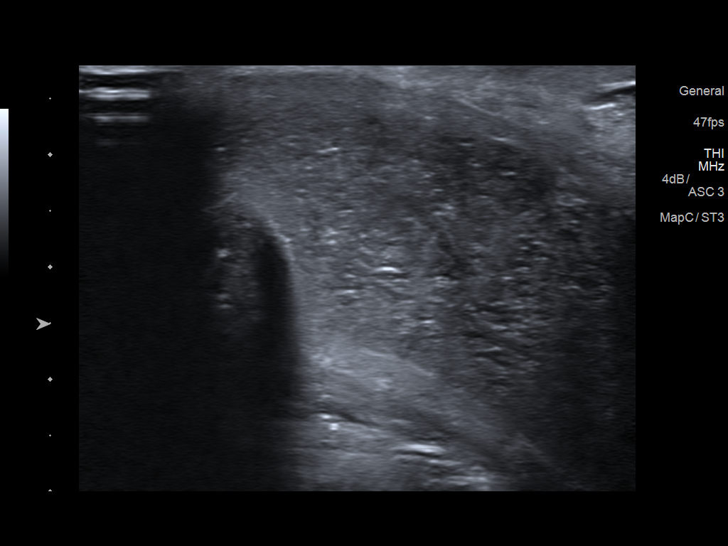

[14 of 25 positions shown; findings below may reference images not displayed]

FINDINGS: Right testicle

Measurements: 4.3 x 2.0 x 3.1 cm. No mass or microlithiasis
visualized.

Left testicle

Measurements: 4.6 x 2.0 x 3.0 cm. No mass or microlithiasis
visualized.

Right epididymis:  Normal in size and appearance.

Left epididymis:  Normal in size and appearance.

Hydrocele:  None visualized.

Varicocele:  None visualized.

Pulsed Doppler interrogation of both testes demonstrates normal low
resistance arterial and venous waveforms bilaterally.

Note is made of an area of avascular soft tissue fullness at the
left penile base. Example image 54 and subsequent cine series.
IMPRESSION: 1. Normal appearance of the testicles and epididymitis.
2. Soft tissue fullness at the left side of the base of the penis.
Given lack of vascularity and clinical history, likely indicative of
a hematoma.

## 2020-12-06 ENCOUNTER — Ambulatory Visit (HOSPITAL_COMMUNITY)
Admission: EM | Admit: 2020-12-06 | Discharge: 2020-12-06 | Disposition: A | Discharge status: Home / Self Care | Payer: Self-pay | Attending: Medical Oncology | Admitting: Medical Oncology

## 2020-12-06 ENCOUNTER — Encounter (HOSPITAL_COMMUNITY): Payer: Self-pay | Admitting: *Deleted

## 2020-12-06 ENCOUNTER — Other Ambulatory Visit: Payer: Self-pay

## 2020-12-06 DIAGNOSIS — B001 Herpesviral vesicular dermatitis: Secondary | ICD-10-CM

## 2020-12-06 MED ORDER — VALACYCLOVIR HCL 1 G PO TABS: 1000.0000 mg | ORAL_TABLET | Freq: Two times a day (BID) | ORAL | 0 refills | Status: AC

## 2020-12-06 NOTE — ED Provider Notes (Signed)
[facility]    CSN: [id number] Arrival date & time: 12/06/20  1453      History   Chief Complaint Chief Complaint  Patient presents with  . Exposure to STD    HPI Nas D Netter is a 30 y.o. male.   HPI   Sore of Lip: Pt reports that they have a sore of their lower lip which they noticed today. They are concerned that this is herpes. They have no known contacts but do state that they share beverages and cigarettes with friends often. No fevers, flu like symptoms or trouble swallowing. They have not tried anything for symptoms. He declines STI testing.    History reviewed. No pertinent past medical history.  There are no problems to display for this patient.   History reviewed. No pertinent surgical history.   Home Medications    Prior to Admission medications   Medication Sig Start Date End Date Taking? Authorizing Provider  HYDROcodone-acetaminophen (NORCO) 5-325 MG per tablet Take 1-2 tablets by mouth every 6 (six) hours as needed for severe pain. 03/21/14   Street, [place], PA-C  methocarbamol (ROBAXIN) 500 MG tablet Take 1 tablet (500 mg total) by mouth 2 (two) times daily. 05/13/17   Audry Pili, PA-C  naproxen (NAPROSYN) 500 MG tablet Take 1 tablet (500 mg total) by mouth 2 (two) times daily. 03/23/15   Janne Napoleon, NP    Family History History reviewed. No pertinent family history.  Social History Social History   Tobacco Use  . Smoking status: Current Every Day Smoker    Packs/day: 0.25    Types: Cigarettes  . Smokeless tobacco: Never Used  Substance Use Topics  . Alcohol use: Yes    Comment: socially   . Drug use: No     Allergies   Patient has no known allergies.   Review of Systems Review of Systems   As stated above in HPI Physical Exam Triage Vital Signs ED Triage Vitals  Enc Vitals Group     BP 12/06/20 1516 127/80     Pulse Rate 12/06/20 1516 62     Resp 12/06/20 1516 65     Temp 12/06/20 1516 98.7 F (37.1 C)     Temp  src --      SpO2 12/06/20 1516 97 %     Weight --      Height --      Head Circumference --      Peak Flow --      Pain Score 12/06/20 1513 0     Pain Loc --      Pain Edu? --      Excl. in GC? --    No data found.  Updated Vital Signs BP 127/80   Pulse 62   Temp 98.7 F (37.1 C)   Resp (!) 156   SpO2 97%   Physical Exam Vitals and nursing note reviewed.  Constitutional:      Appearance: Normal appearance.  HENT:     Head: Normocephalic and atraumatic.     Nose: Nose normal.     Mouth/Throat:     Comments: 4 small clear fluid filled vesicles of the left lower lip. No ulcers or lesions of interior mouth Eyes:     Extraocular Movements: Extraocular movements intact.     Pupils: Pupils are equal, round, and reactive to light.  Skin:    Findings: No rash.  Neurological:     Mental Status: He is alert.  UC Treatments / Results  Labs (all labs ordered are listed, but only abnormal results are displayed) Labs Reviewed - No data to display  EKG   Radiology No results found.  Procedures Procedures (including critical care time)  Medications Ordered in UC Medications - No data to display  Initial Impression / Assessment and Plan / UC Course  I have reviewed the triage vital signs and the nursing notes.  Pertinent labs & imaging results that were available during my care of the patient were reviewed by me and considered in my medical decision making (see chart for details).     New. Discussed cold sores including how they are transmitted, how common they are and treatment options. He is aware that he can get recurrent episodes. Pt wishes to have medication. Discussed ways to prevent transmission as well. He declines further testing.    Final Clinical Impressions(s) / UC Diagnoses   Final diagnoses:  None   Discharge Instructions   None    ED Prescriptions    None     PDMP not reviewed this encounter.   Rushie Chestnut, [place] 12/06/20  [phone]

## 2020-12-06 NOTE — ED Triage Notes (Signed)
Pt has a sore on loer lip.Marland Kitchen

## 2022-04-21 ENCOUNTER — Other Ambulatory Visit: Payer: Self-pay

## 2022-04-21 ENCOUNTER — Emergency Department (HOSPITAL_COMMUNITY)
Admission: EM | Admit: 2022-04-21 | Discharge: 2022-04-21 | Disposition: A | Discharge status: Home / Self Care | Payer: Self-pay | Attending: Emergency Medicine | Admitting: Emergency Medicine

## 2022-04-21 ENCOUNTER — Encounter (HOSPITAL_COMMUNITY): Payer: Self-pay

## 2022-04-21 DIAGNOSIS — W268XXA Contact with other sharp object(s), not elsewhere classified, initial encounter: Secondary | ICD-10-CM | POA: Insufficient documentation

## 2022-04-21 DIAGNOSIS — S51812A Laceration without foreign body of left forearm, initial encounter: Secondary | ICD-10-CM
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
## 2022-04-21 NOTE — Discharge Instructions (Addendum)
Adhesive tape repair Tape strips have the advantage of being less painful to apply and lower risk of infection, but do require more caution on your part, as they are more fragile than other skin closure techniques.  It's important to: -Keep the tape clean and dry -Avoid picking at the tape or rubbing the area -Avoid soaking in water (showering is okay-bathing is not) -The tape strips will fall off on their own in about 5-7 days (if they don't, you can gently remove them or soak the wound in water at this time to loosen them).  At this time, scar tissue will be forming under the surface of the wound and your body will do the rest of the work of healing.  Once the tape strips fall off apply Vaseline 2-3 times a day to keep the area moist.  Do not use any hydrogen peroxide or iodine containing cleaning solutions.  Just use warm soap and water.

## 2022-09-08 ENCOUNTER — Ambulatory Visit (HOSPITAL_COMMUNITY): Admission: EM | Admit: 2022-09-08 | Discharge: 2022-09-08 | Discharge status: Against Medical Advice | Payer: Self-pay

## 2022-09-08 NOTE — ED Notes (Signed)
Patient called x2 no answer. 

## 2022-09-08 NOTE — ED Notes (Signed)
Called x1. No answer.

## 2022-09-08 NOTE — ED Notes (Signed)
No answer

## 2022-09-08 NOTE — ED Notes (Signed)
Patient called x 3. No answer.

## 2023-12-10 ENCOUNTER — Other Ambulatory Visit: Payer: Self-pay

## 2023-12-10 ENCOUNTER — Emergency Department (HOSPITAL_BASED_OUTPATIENT_CLINIC_OR_DEPARTMENT_OTHER)
Admission: EM | Admit: 2023-12-10 | Discharge: 2023-12-11 | Disposition: A | Discharge status: Home / Self Care | Payer: Self-pay | Attending: Emergency Medicine | Admitting: Emergency Medicine

## 2023-12-10 ENCOUNTER — Encounter (HOSPITAL_BASED_OUTPATIENT_CLINIC_OR_DEPARTMENT_OTHER): Payer: Self-pay | Admitting: *Deleted

## 2023-12-10 DIAGNOSIS — Z23 Encounter for immunization: Secondary | ICD-10-CM | POA: Insufficient documentation

## 2023-12-10 DIAGNOSIS — W503XXA Accidental bite by another person, initial encounter: Secondary | ICD-10-CM

## 2023-12-10 DIAGNOSIS — S01511A Laceration without foreign body of lip, initial encounter: Secondary | ICD-10-CM | POA: Insufficient documentation

## 2023-12-10 MED ORDER — LIDOCAINE HCL (PF) 1 % IJ SOLN
5.0000 mL | Freq: Once | INTRAMUSCULAR | Status: AC
  Administered 2023-12-11: 5 mL
  Filled 2023-12-10: qty 5
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
## 2023-12-11 MED ORDER — AMOXICILLIN-POT CLAVULANATE 500-125 MG PO TABS: 1.0000 | ORAL_TABLET | Freq: Three times a day (TID) | ORAL | 0 refills | Status: AC

## 2023-12-11 MED ORDER — TETANUS-DIPHTH-ACELL PERTUSSIS 5-2.5-18.5 LF-MCG/0.5 IM SUSY
0.5000 mL | PREFILLED_SYRINGE | Freq: Once | INTRAMUSCULAR | Status: AC
  Administered 2023-12-11: 0.5 mL via INTRAMUSCULAR
  Filled 2023-12-11: qty 0.5

## 2023-12-11 MED ORDER — AMOXICILLIN-POT CLAVULANATE 875-125 MG PO TABS
1.0000 | ORAL_TABLET | Freq: Once | ORAL | Status: AC
  Administered 2023-12-11: 1 via ORAL
  Filled 2023-12-11: qty 1

## 2023-12-11 NOTE — Discharge Instructions (Signed)
 Begin taking Augmentin as prescribed.  Perform saline rinses several times daily for the next several days.  Return to the emergency department for pus draining from the wound, increased redness surrounding the wound, or for other new and concerning symptoms.
